# Patient Record
Sex: Female | Born: 1960 | Race: White | Hispanic: No | Marital: Married | Smoking: Never smoker
Health system: Southern US, Community
[De-identification: ages and names within clinical notes are randomized; demographics above are authoritative.]

## PROBLEM LIST (undated history)

## (undated) DIAGNOSIS — H9190 Unspecified hearing loss, unspecified ear: Secondary | ICD-10-CM

---
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
## 2009-11-06 ENCOUNTER — Emergency Department (HOSPITAL_COMMUNITY): Admission: EM | Admit: 2009-11-06 | Discharge: 2009-11-06 | Payer: Self-pay | Admitting: Family Medicine

## 2019-04-19 ENCOUNTER — Encounter (HOSPITAL_COMMUNITY): Payer: Self-pay | Admitting: Emergency Medicine

## 2019-04-19 ENCOUNTER — Other Ambulatory Visit: Payer: Self-pay

## 2019-04-19 ENCOUNTER — Emergency Department (HOSPITAL_COMMUNITY)
Admission: EM | Admit: 2019-04-19 | Discharge: 2019-04-19 | Disposition: A | Discharge status: Home / Self Care | Payer: BC Managed Care – PPO | Attending: Emergency Medicine | Admitting: Emergency Medicine

## 2019-04-19 ENCOUNTER — Emergency Department (HOSPITAL_COMMUNITY): Payer: BC Managed Care – PPO

## 2019-04-19 DIAGNOSIS — W19XXXA Unspecified fall, initial encounter: Secondary | ICD-10-CM

## 2019-04-19 DIAGNOSIS — M25552 Pain in left hip: Secondary | ICD-10-CM | POA: Diagnosis not present

## 2019-04-19 DIAGNOSIS — S299XXA Unspecified injury of thorax, initial encounter: Secondary | ICD-10-CM | POA: Diagnosis not present

## 2019-04-19 DIAGNOSIS — R0781 Pleurodynia: Secondary | ICD-10-CM | POA: Insufficient documentation

## 2019-04-19 HISTORY — DX: Unspecified hearing loss, unspecified ear: H91.90

## 2019-04-19 MED ORDER — LIDOCAINE 5 % EX PTCH: 1.0000 | MEDICATED_PATCH | CUTANEOUS | 0 refills | Status: DC

## 2019-04-19 MED ORDER — DICLOFENAC SODIUM 1 % TD GEL: 4.0000 g | Freq: Four times a day (QID) | TRANSDERMAL | 0 refills | Status: DC

## 2019-04-19 NOTE — Discharge Instructions (Addendum)
Expect your soreness to increase over the next 2-3 days. Take it easy, but do not lay around too much as this may make any stiffness worse.  Antiinflammatory medications: Take 600 mg of ibuprofen every 6 hours or 440 mg (over the counter dose) to 500 mg (prescription dose) of naproxen every 12 hours for the next 3 days. After this time, these medications may be used as needed for pain. Take these medications with food to avoid upset stomach. Choose only one of these medications, do not take them together. Acetaminophen (generic for Tylenol): Should you continue to have additional pain while taking the ibuprofen or naproxen, you may add in acetaminophen as needed. Your daily total maximum amount of acetaminophen from all sources should be limited to 4000mg /day for persons without liver problems, or 2000mg /day for those with liver problems. Diclofenac gel: This is a topical anti-inflammatory medication and can be applied directly to the painful region.  Do not use on the face or genitals.  This medication may be used as an alternative to oral anti-inflammatory medications, such as ibuprofen or naproxen. Lidocaine patches: These are available via either prescription or over-the-counter. The over-the-counter option may be more economical one and are likely just as effective. There are multiple over-the-counter brands, such as Salonpas. Exercises: Be sure to perform the attached exercises starting with three times a week and working up to performing them daily. This is an essential part of preventing long term problems.  Follow up: Follow up with a primary care provider for any future management of these complaints. Be sure to follow up within 7-10 days. Return: Return to the ED should symptoms worsen.  For prescription assistance, may try using prescription discount sites or apps, such as goodrx.com  Your blood pressure was noted to be higher than normal today.  Please have this rechecked through a primary  care provider.

## 2019-04-19 NOTE — ED Triage Notes (Signed)
Patient c/o back pain from sacral area to head. Per patient slide down some wet steps hitting lower back, neck, and head. Denies LOC or dizziness.

## 2019-04-19 NOTE — ED Provider Notes (Signed)
[facility]   CSN: [id number] Arrival date & time: 04/19/19  1111    History   Chief Complaint Chief Complaint  Patient presents with  . Fall    HPI Kristen Fry is a 58 y.o. female.     No language interpreter was used (Patient is comfortable reading lips and this method of communication was utilized.).     Kristen GamblesDebra Fry is a 58 y.o. female, with a history of hard of hearing, presenting to the ED for evaluation following a fall that occurred a few hours prior to arrival. She states she slipped on some wet steps and scraped her left posterior ribs across the steps.  She also states she hit the back of her head. She states her pain is mostly to the left posterior ribs, described as a soreness, mild, nonradiating. Denies anticoagulation. Denies LOC, nausea/vomiting, difficulty breathing, chest pain, abdominal pain, dizziness, numbness, weakness, neck pain, other back pain/midline back pain, or any other complaints.   Past Medical History:  Diagnosis Date  . HOH (hard of hearing)     There are no active problems to display for this patient.   History reviewed. No pertinent surgical history.   OB History   No obstetric history on file.      Home Medications    Prior to Admission medications   Medication Sig Start Date End Date Taking? Authorizing Provider  diclofenac sodium (VOLTAREN) 1 % GEL Apply 4 g topically 4 (four) times daily. 04/19/19   Shalandra Leu C, PA-C  lidocaine (LIDODERM) 5 % Place 1 patch onto the skin daily. Remove & Discard patch within 12 hours or as directed by MD 04/19/19   Anselm PancoastJoy, Abeeha Twist C, PA-C    Family History No family history on file.  Social History Social History   Tobacco Use  . Smoking status: Never Smoker  . Smokeless tobacco: Never Used  Substance Use Topics  . Alcohol use: Never    Frequency: Never    Comment: rarely  . Drug use: Never     Allergies   Patient has no known allergies.   Review  of Systems Review of Systems  Constitutional: Negative for diaphoresis.  Respiratory: Negative for shortness of breath.   Cardiovascular: Negative for chest pain.  Gastrointestinal: Negative for abdominal pain, nausea and vomiting.  Musculoskeletal: Negative for back pain and neck pain.       Left posterior rib pain  Neurological: Negative for dizziness, syncope, weakness, light-headedness, numbness and headaches.  All other systems reviewed and are negative.    Physical Exam Updated Vital Signs BP (!) 180/95 (BP Location: Right Arm)   Pulse 89   Temp 98.1 F (36.7 C) (Oral)   Resp 16   Ht 5\' 3"  (1.6 m)   Wt 67.1 kg   SpO2 99%   BMI 26.22 kg/m   Physical Exam Vitals signs and nursing note reviewed.  Constitutional:      General: She is not in acute distress.    Appearance: She is well-developed. She is not diaphoretic.  HENT:     Head: Normocephalic.     Comments: She has some mild tenderness to the right occipital scalp without swelling, deformity, instability, or noted wounds.    Nose: Nose normal.     Mouth/Throat:     Mouth: Mucous membranes are moist.     Pharynx: Oropharynx is clear.  Eyes:     Extraocular Movements: Extraocular movements intact.     Conjunctiva/sclera: Conjunctivae  normal.     Pupils: Pupils are equal, round, and reactive to light.  Neck:     Musculoskeletal: Full passive range of motion without pain, normal range of motion and neck supple.  Cardiovascular:     Rate and Rhythm: Normal rate and regular rhythm.     Pulses: Normal pulses.          Radial pulses are 2+ on the right side and 2+ on the left side.       Posterior tibial pulses are 2+ on the right side and 2+ on the left side.     Heart sounds: Normal heart sounds.     Comments: Tactile temperature in the extremities appropriate and equal bilaterally. Pulmonary:     Effort: Pulmonary effort is normal. No respiratory distress.     Breath sounds: Normal breath sounds.  Abdominal:      Palpations: Abdomen is soft.     Tenderness: There is no abdominal tenderness. There is no guarding.  Musculoskeletal:       Back:     Right lower leg: No edema.     Left lower leg: No edema.     Comments: Small, scattered, superficial abrasions to left posterior ribs, as shown.  Very mild diffuse tenderness over the left posterior ribs without swelling, crepitus, deformity, or instability.  Full range of motion through the cardinal directions of the cervical spine without pain or noted difficulty.  Normal motor function intact in all extremities. No midline spinal tenderness.   Skin:    General: Skin is warm and dry.  Neurological:     Mental Status: She is alert and oriented to person, place, and time.     Comments: Sensation grossly intact to light touch in the extremities.  Grip strengths equal bilaterally.  Strength 5/5 in all extremities. No gait disturbance. Coordination intact. Cranial nerves III-XII grossly intact. No facial droop.   Psychiatric:        Mood and Affect: Mood and affect normal.        Speech: Speech normal.        Behavior: Behavior normal.      ED Treatments / Results  Labs (all labs ordered are listed, but only abnormal results are displayed) Labs Reviewed - No data to display  EKG None  Radiology Dg Ribs Unilateral W/chest Left  Result Date: 04/19/2019 CLINICAL DATA:  Fall down stairs earlier today, pain LEFT posterior mid to lower ribs. EXAM: LEFT RIBS AND CHEST - 3+ VIEW COMPARISON:  None. FINDINGS: Single-view of the chest: Heart size and mediastinal contours are within normal limits. Lungs are clear. No pleural effusion or pneumothorax. Osseous structures about the chest are unremarkable. No LEFT-sided rib fracture or displacement seen. IMPRESSION: 1. Negative. 2. No displaced rib fracture. Electronically Signed   By: Bary RichardStan  Maynard M.D.   On: 04/19/2019 12:26    Procedures Procedures (including critical care time)  Medications Ordered in ED  Medications - No data to display   Initial Impression / Assessment and Plan / ED Course  I have reviewed the triage vital signs and the nursing notes.  Pertinent labs & imaging results that were available during my care of the patient were reviewed by me and considered in my medical decision making (see chart for details).  Clinical Course as of Apr 18 1250  Wynelle LinkSun Apr 19, 2019  1230 This was discussed with the patient.  She states her blood pressure is "sometimes high."  We spoke about appropriate primary  care follow-up on this matter.  BP(!): 180/95 [SJ]    Clinical Course User Index [SJ] Nikkol Pai C, PA-C       Patient presents for evaluation following a slip and fall that occurred several hours prior to arrival.  She has some soreness to the left posterior ribs and some soreness to the right posterior scalp, however, she has no evidence of neurologic abnormalities.  No evidence of more severe injury.  No acute abnormality on rib x-ray. The patient was given instructions for home care as well as return precautions. Patient voices understanding of these instructions, accepts the plan, and is comfortable with discharge.   Final Clinical Impressions(s) / ED Diagnoses   Final diagnoses:  Fall, initial encounter  Rib pain on left side    ED Discharge Orders         Ordered    diclofenac sodium (VOLTAREN) 1 % GEL  4 times daily     04/19/19 1241    lidocaine (LIDODERM) 5 %  Every 24 hours     04/19/19 [place], Rexine Gowens C, PA-C 04/19/19 1254    Fredia Sorrow, MD 04/19/19 1545

## 2019-04-19 NOTE — ED Notes (Signed)
Fell down stairs this am   Now with pain to L posterior rib area   Hit back of head in fall denies LOC  Pt is very hard of hearing with hearing aid use

## 2019-04-19 NOTE — ED Notes (Signed)
PA in to assess 

## 2019-05-08 ENCOUNTER — Other Ambulatory Visit: Payer: Self-pay

## 2019-05-08 ENCOUNTER — Ambulatory Visit (INDEPENDENT_AMBULATORY_CARE_PROVIDER_SITE_OTHER): Payer: BC Managed Care – PPO | Admitting: Family Medicine

## 2019-05-08 ENCOUNTER — Encounter: Payer: Self-pay | Admitting: Family Medicine

## 2019-05-08 VITALS — BP 134/86 | Temp 97.6°F | Wt 148.4 lb

## 2019-05-08 DIAGNOSIS — H9190 Unspecified hearing loss, unspecified ear: Secondary | ICD-10-CM

## 2019-05-08 DIAGNOSIS — H9193 Unspecified hearing loss, bilateral: Secondary | ICD-10-CM | POA: Diagnosis not present

## 2019-05-08 DIAGNOSIS — R030 Elevated blood-pressure reading, without diagnosis of hypertension: Secondary | ICD-10-CM | POA: Diagnosis not present

## 2019-05-08 DIAGNOSIS — IMO0001 Reserved for inherently not codable concepts without codable children: Secondary | ICD-10-CM

## 2019-05-08 HISTORY — DX: Unspecified hearing loss, unspecified ear: H91.90

## 2019-05-08 HISTORY — DX: Reserved for inherently not codable concepts without codable children: IMO0001

## 2019-05-08 NOTE — Progress Notes (Signed)
   Subjective:    Patient ID: Kristen Fry, female    DOB: 1961/08/02, 58 y.o.   MRN: [id number]  HPI New patient Has no history of high blood pressure diabetes Does have history of hyperlipidemia Overall stays healthy active with her work but does not do any walking on a regular basis Patient does have hearing impairment  Patient arrives for a follow up from a recent ER visit. Patient states she fell and when she was at the ER they told her her BP was elevated and she should have it rechecked.   Review of Systems  Constitutional: Negative for activity change, appetite change and fatigue.  HENT: Negative for congestion and rhinorrhea.   Respiratory: Negative for cough and shortness of breath.   Cardiovascular: Negative for chest pain and leg swelling.  Gastrointestinal: Negative for abdominal pain and diarrhea.  Endocrine: Negative for polydipsia and polyphagia.  Skin: Negative for color change.  Neurological: Negative for dizziness and weakness.  Psychiatric/Behavioral: Negative for behavioral problems and confusion.       Objective:   Physical Exam  Lungs clear respiratory rate normal heart regular no murmurs pulses normal extremities no edema skin warm dry blood pressure checked several different times  We did discuss low-salt diet as well as walking on a regular basis    Assessment & Plan:  Elevated blood pressure on ER visit Currently blood pressure looks good Patient will do the best she can at watching diet staying physically active We will do a wellness to follow-up later this fall Patient will bring back lab work for [place] to review  Hearing impairment able for the patient to understand what was going on by her reading lips elevated blood pressure

## 2019-05-08 NOTE — Patient Instructions (Signed)

## 2019-07-24 ENCOUNTER — Encounter: Payer: BC Managed Care – PPO | Admitting: Nurse Practitioner

## 2019-08-14 ENCOUNTER — Encounter: Payer: BC Managed Care – PPO | Admitting: Family Medicine

## 2019-10-09 ENCOUNTER — Ambulatory Visit (INDEPENDENT_AMBULATORY_CARE_PROVIDER_SITE_OTHER): Payer: BC Managed Care – PPO | Admitting: Nurse Practitioner

## 2019-10-09 ENCOUNTER — Encounter: Payer: Self-pay | Admitting: Nurse Practitioner

## 2019-10-09 VITALS — BP 148/80 | Temp 97.7°F | Ht 62.5 in | Wt 145.8 lb

## 2019-10-09 DIAGNOSIS — Z114 Encounter for screening for human immunodeficiency virus [HIV]: Secondary | ICD-10-CM | POA: Diagnosis not present

## 2019-10-09 DIAGNOSIS — Z1231 Encounter for screening mammogram for malignant neoplasm of breast: Secondary | ICD-10-CM

## 2019-10-09 DIAGNOSIS — Z1159 Encounter for screening for other viral diseases: Secondary | ICD-10-CM

## 2019-10-09 DIAGNOSIS — Z1211 Encounter for screening for malignant neoplasm of colon: Secondary | ICD-10-CM

## 2019-10-09 DIAGNOSIS — Z0000 Encounter for general adult medical examination without abnormal findings: Secondary | ICD-10-CM | POA: Diagnosis not present

## 2019-10-09 DIAGNOSIS — Z13220 Encounter for screening for lipoid disorders: Secondary | ICD-10-CM

## 2019-10-09 DIAGNOSIS — Z124 Encounter for screening for malignant neoplasm of cervix: Secondary | ICD-10-CM | POA: Diagnosis not present

## 2019-10-09 DIAGNOSIS — Z1151 Encounter for screening for human papillomavirus (HPV): Secondary | ICD-10-CM | POA: Diagnosis not present

## 2019-10-09 DIAGNOSIS — Z01419 Encounter for gynecological examination (general) (routine) without abnormal findings: Secondary | ICD-10-CM

## 2019-10-09 DIAGNOSIS — E559 Vitamin D deficiency, unspecified: Secondary | ICD-10-CM | POA: Diagnosis not present

## 2019-10-09 NOTE — Progress Notes (Signed)
Subjective:    Patient ID: Kristen Fry, female    DOB: [birth year], 59 y.o.   MRN: [id number]  HPI The patient comes in today for a wellness visit.    A review of their health history was completed.  A review of medications was also completed.  Any needed refills; not on any meds  Eating habits: health conscious  Falls/  MVA accidents in past few months: none  Regular exercise: walking  Specialist pt sees on regular basis: none  Preventative health issues were discussed.   Additional concerns: none   Review of Systems  Constitutional: Negative for activity change, appetite change, fatigue and fever.  HENT: Positive for hearing loss.        Wears hearing aids in both ears.  Respiratory: Negative for cough, chest tightness, shortness of breath and wheezing.   Cardiovascular: Negative for chest pain.  Gastrointestinal: Negative for abdominal pain, blood in stool, constipation, diarrhea, nausea and vomiting.  Genitourinary: Negative for difficulty urinating, dysuria, enuresis, frequency, genital sores, menstrual problem, pelvic pain and urgency.  Skin: Negative for rash.    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 No family history of colon, breast, or female cancers. Patient has never had a colonoscopy and declines one today. Patient is married and has had same sexual partner. No complaints of vaginal bleeding or pelvic pain.   Depression screen PHQ 2/9 10/09/2019  Decreased Interest 0  Down, Depressed, Hopeless 0  PHQ - 2 Score 0        Objective:   Physical Exam Constitutional:      General: She is not in acute distress.    Appearance: Normal appearance.  Neck:     Comments: Thyroid non tender to palpation. No mass or goiter noted.  Cardiovascular:     Rate and Rhythm: Normal rate and regular rhythm.      Pulses: Normal pulses.     Heart sounds: Normal heart sounds. No murmur.  Pulmonary:     Effort: Pulmonary effort is normal.     Breath sounds: Normal breath sounds.  Chest:     Breasts:        Right: Normal. No swelling, inverted nipple, mass, skin change or tenderness.        Left: Normal. No swelling, inverted nipple, mass, skin change or tenderness.  Abdominal:     General: There is no distension.     Palpations: Abdomen is soft. There is no mass.     Tenderness: There is no abdominal tenderness.  Genitourinary:    General: Normal vulva.     Vagina: No vaginal discharge.     Comments: External GU: no rashes or lesions. Vagina: pink, no discharge. Cervix: normal in appearance; no CMT; Bimanual: no tenderness or obvious masses.  Musculoskeletal:     Cervical back: Normal range of motion.  Lymphadenopathy:     Cervical: No cervical adenopathy.     Upper Body:     Right upper body: No supraclavicular, axillary or pectoral adenopathy.     Left upper body: No supraclavicular, axillary or pectoral adenopathy.  Skin:    General: Skin is warm and dry.  Neurological:     Mental Status: She is alert and oriented to person, place, and time.  Psychiatric:        Mood and Affect:  Mood normal.        Behavior: Behavior normal.        Thought Content: Thought content normal.        Judgment: Judgment normal.         Assessment & Plan:   Problem List Items Addressed This Visit    None    Visit Diagnoses    Well woman exam    -  Primary   Relevant Orders   Lipid panel   Hepatic function panel   CBC with Differential/Platelet   VITAMIN D 25 Hydroxy (Vit-D Deficiency, Fractures)   Basic metabolic panel   Hepatitis C antibody   HIV Antibody (routine testing w rflx)   IFOBT POC (occult bld, rslt in office)   IGP, Aptima HPV   Encounter for screening mammogram for breast cancer       Relevant Orders   MM 3D SCREEN BREAST BILATERAL   Screen for colon cancer       Relevant  Orders   IFOBT POC (occult bld, rslt in office)   Screening for HIV (human immunodeficiency virus)       Relevant Orders   HIV Antibody (routine testing w rflx)   Encounter for hepatitis C screening test for low risk patient       Screening for lipid disorders       Relevant Orders   Lipid panel   Screening for cervical cancer       Relevant Orders   IGP, Aptima HPV   Screening for HPV (human papillomavirus)       Relevant Orders   IGP, Aptima HPV     Labs pending. Defers colonoscopy but agrees to iFOBT. Recommend tetanus update at local pharmacy. Return in about 1 year (around 10/08/2020) for physical.   I have seen and examined this patient alongside the NP student and agree with the  assessment and plan.   Pearson Forster, FNP

## 2019-10-10 LAB — HIV ANTIBODY (ROUTINE TESTING W REFLEX): HIV Screen 4th Generation wRfx: NONREACTIVE

## 2019-10-10 LAB — BASIC METABOLIC PANEL
BUN/Creatinine Ratio: 21 (ref 9–23)
BUN: 14 mg/dL (ref 6–24)
CO2: 21 mmol/L (ref 20–29)
Calcium: 9.8 mg/dL (ref 8.7–10.2)
Chloride: 102 mmol/L (ref 96–106)
Creatinine, Ser: 0.68 mg/dL (ref 0.57–1.00)
GFR calc Af Amer: 113 mL/min/{1.73_m2} (ref >59)
GFR calc non Af Amer: 98 mL/min/{1.73_m2} (ref >59)
Glucose: 99 mg/dL (ref 65–99)
Potassium: 4.4 mmol/L (ref 3.5–5.2)
Sodium: 141 mmol/L (ref 134–144)

## 2019-10-10 LAB — LIPID PANEL
Chol/HDL Ratio: 1.9 ratio (ref 0.0–4.4)
Cholesterol, Total: 259 mg/dL — ABNORMAL HIGH (ref 100–199)
HDL: 139 mg/dL (ref >39)
LDL Chol Calc (NIH): 110 mg/dL — ABNORMAL HIGH (ref 0–99)
Triglycerides: 62 mg/dL (ref 0–149)
VLDL Cholesterol Cal: 10 mg/dL (ref 5–40)

## 2019-10-10 LAB — CBC WITH DIFFERENTIAL/PLATELET
Basophils Absolute: 0.1 10*3/uL (ref 0.0–0.2)
Basos: 1 %
EOS (ABSOLUTE): 0 10*3/uL (ref 0.0–0.4)
Eos: 1 %
Hematocrit: 42.6 % (ref 34.0–46.6)
Hemoglobin: 14 g/dL (ref 11.1–15.9)
Immature Grans (Abs): 0 10*3/uL (ref 0.0–0.1)
Immature Granulocytes: 0 %
Lymphocytes Absolute: 1.5 10*3/uL (ref 0.7–3.1)
Lymphs: 24 %
MCH: 30.2 pg (ref 26.6–33.0)
MCHC: 32.9 g/dL (ref 31.5–35.7)
MCV: 92 fL (ref 79–97)
Monocytes Absolute: 0.5 10*3/uL (ref 0.1–0.9)
Monocytes: 8 %
Neutrophils Absolute: 4 10*3/uL (ref 1.4–7.0)
Neutrophils: 66 %
Platelets: 462 10*3/uL — ABNORMAL HIGH (ref 150–450)
RBC: 4.63 x10E6/uL (ref 3.77–5.28)
RDW: 12.7 % (ref 11.7–15.4)
WBC: 6.1 10*3/uL (ref 3.4–10.8)

## 2019-10-10 LAB — HEPATIC FUNCTION PANEL
ALT: 20 IU/L (ref 0–32)
AST: 21 IU/L (ref 0–40)
Albumin: 4.6 g/dL (ref 3.8–4.9)
Alkaline Phosphatase: 68 IU/L (ref 39–117)
Bilirubin Total: 0.4 mg/dL (ref 0.0–1.2)
Bilirubin, Direct: 0.13 mg/dL (ref 0.00–0.40)
Total Protein: 7.3 g/dL (ref 6.0–8.5)

## 2019-10-10 LAB — VITAMIN D 25 HYDROXY (VIT D DEFICIENCY, FRACTURES): Vit D, 25-Hydroxy: 28.6 ng/mL — ABNORMAL LOW (ref 30.0–100.0)

## 2019-10-10 LAB — HEPATITIS C ANTIBODY: Hep C Virus Ab: <0.1 s/co ratio (ref 0.0–0.9)

## 2019-10-12 ENCOUNTER — Other Ambulatory Visit: Payer: Self-pay

## 2019-10-12 DIAGNOSIS — Z1211 Encounter for screening for malignant neoplasm of colon: Secondary | ICD-10-CM

## 2019-10-12 DIAGNOSIS — Z01419 Encounter for gynecological examination (general) (routine) without abnormal findings: Secondary | ICD-10-CM

## 2019-10-12 LAB — IFOBT (OCCULT BLOOD): IFOBT: NEGATIVE

## 2019-10-15 LAB — IGP, APTIMA HPV: HPV Aptima: NEGATIVE

## 2019-10-16 ENCOUNTER — Ambulatory Visit (HOSPITAL_COMMUNITY): Payer: PRIVATE HEALTH INSURANCE

## 2019-10-23 ENCOUNTER — Other Ambulatory Visit: Payer: Self-pay

## 2019-10-23 ENCOUNTER — Ambulatory Visit (HOSPITAL_COMMUNITY)
Admission: RE | Admit: 2019-10-23 | Discharge: 2019-10-23 | Disposition: A | Discharge status: Home / Self Care | Payer: BC Managed Care – PPO | Source: Ambulatory Visit | Attending: Nurse Practitioner | Admitting: Nurse Practitioner

## 2019-10-23 DIAGNOSIS — Z1231 Encounter for screening mammogram for malignant neoplasm of breast: Secondary | ICD-10-CM | POA: Insufficient documentation

## 2019-12-07 ENCOUNTER — Telehealth: Payer: Self-pay | Admitting: Family Medicine

## 2019-12-07 NOTE — Telephone Encounter (Signed)
Physical form placed in nurses box. Return to [facility] when done. (For [place])

## 2019-12-07 NOTE — Telephone Encounter (Signed)
Form on carolyn's desk 

## 2019-12-14 NOTE — Telephone Encounter (Signed)
[name], I reviewed Kristen Fry's PE form. I did not see a space for required signature. Will get this back to you.  Thanks, Eber Jones

## 2020-09-03 IMAGING — DX LEFT RIBS AND CHEST - 3+ VIEW
4 series · 4 of 4 positions shown · non-contrast
Comparison: None.

CLINICAL DATA: Fall down stairs earlier today, pain LEFT posterior
mid to lower ribs.

EXAM:
LEFT RIBS AND CHEST - 3+ VIEW

[chest pa]
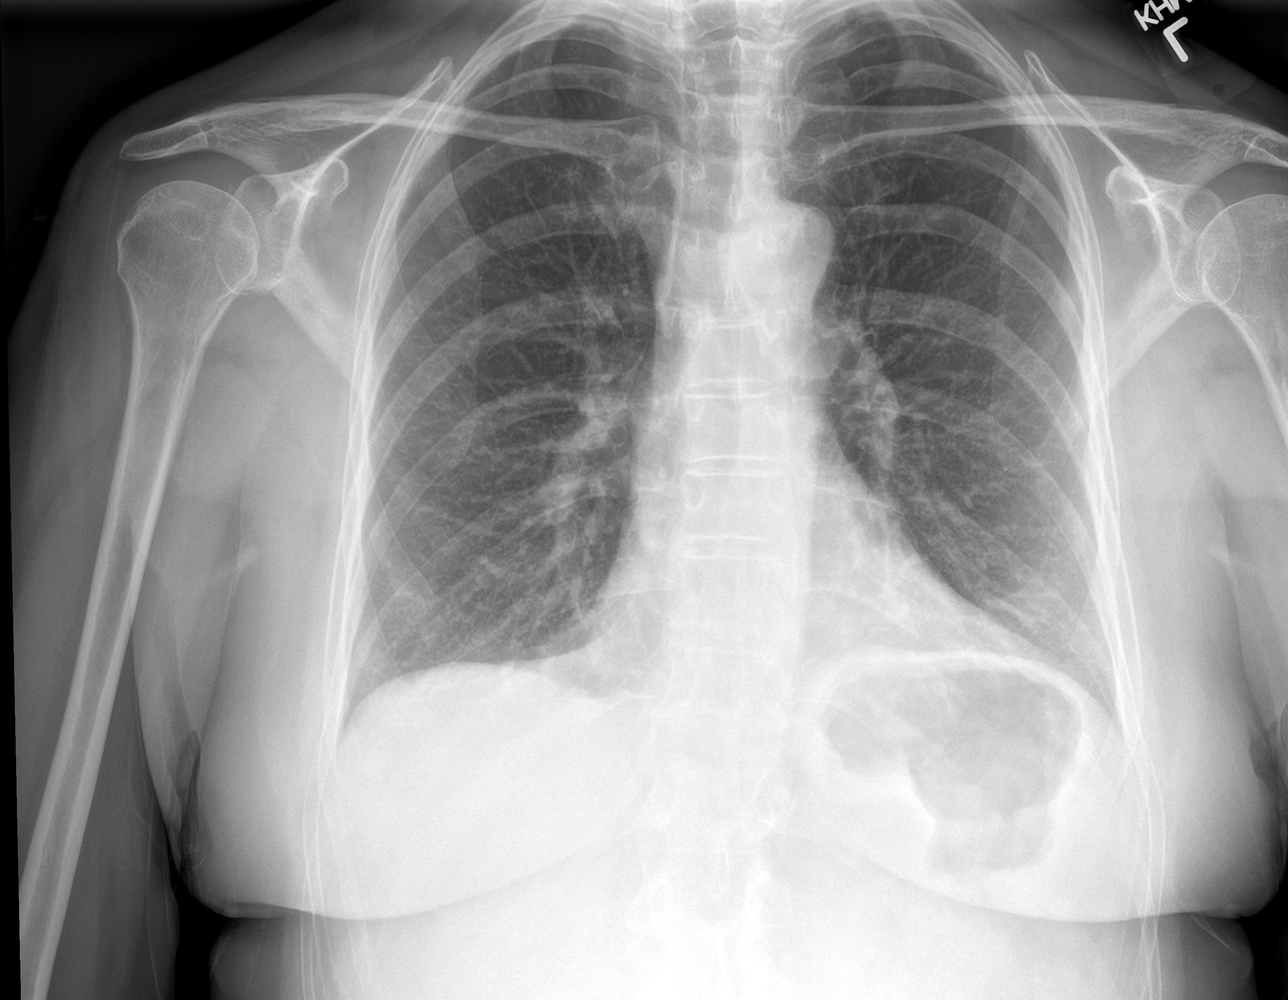

[rib pa obl]
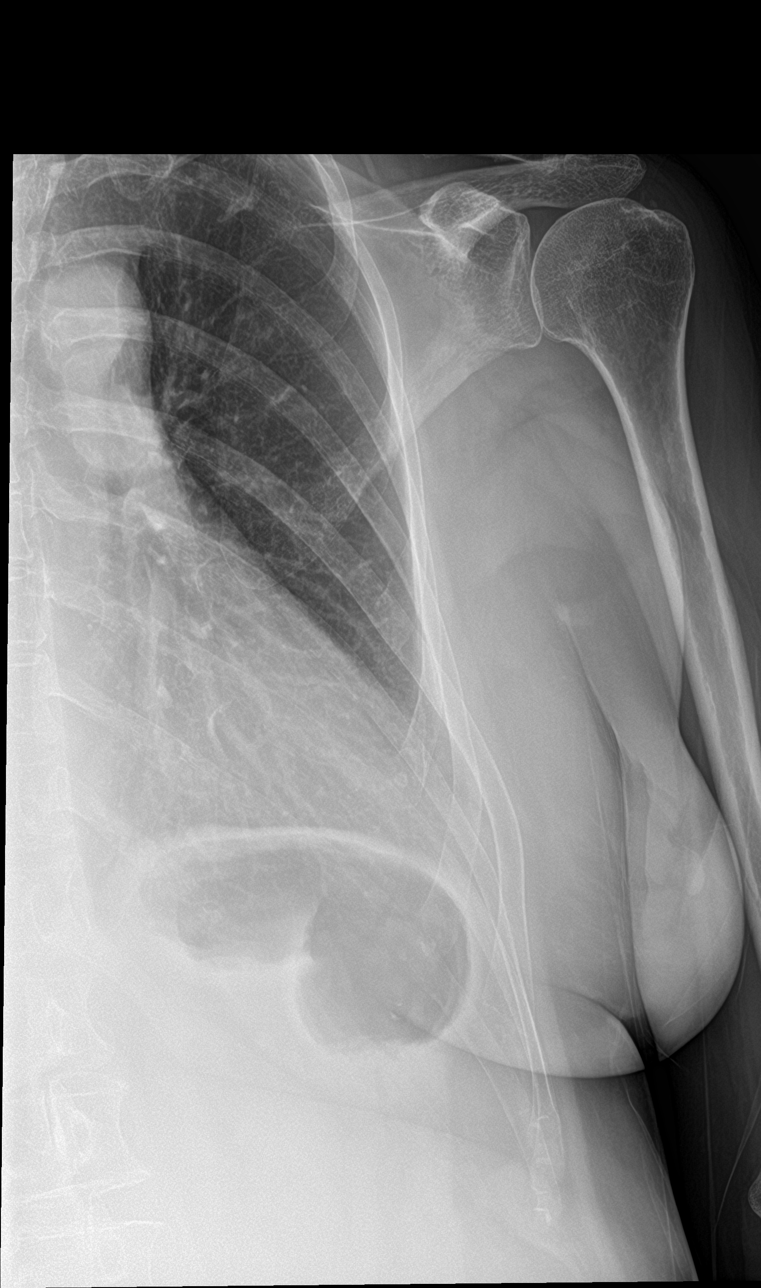

[rib pa (1 of 2)]
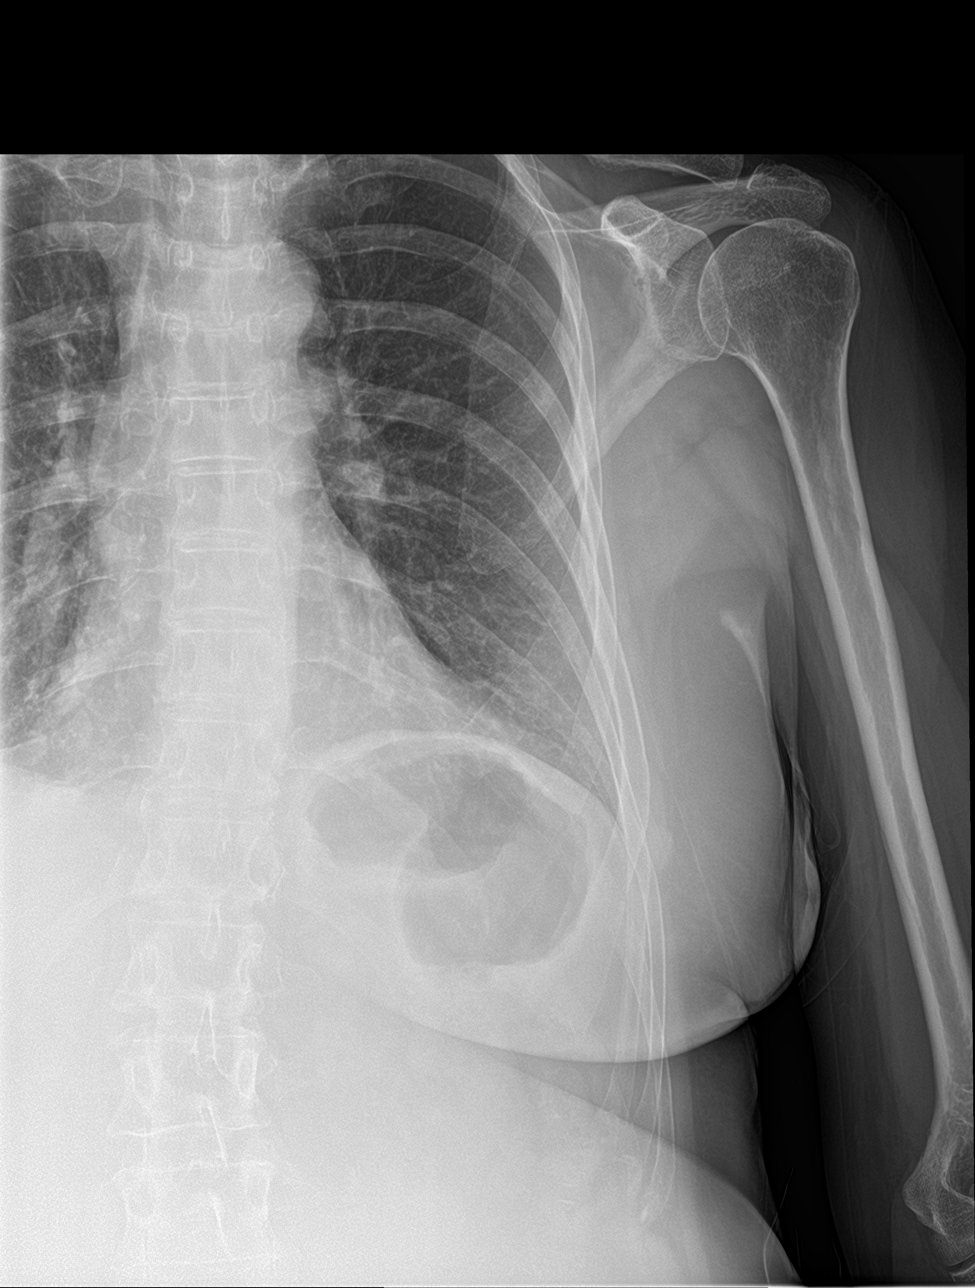

[rib pa (2 of 2)]
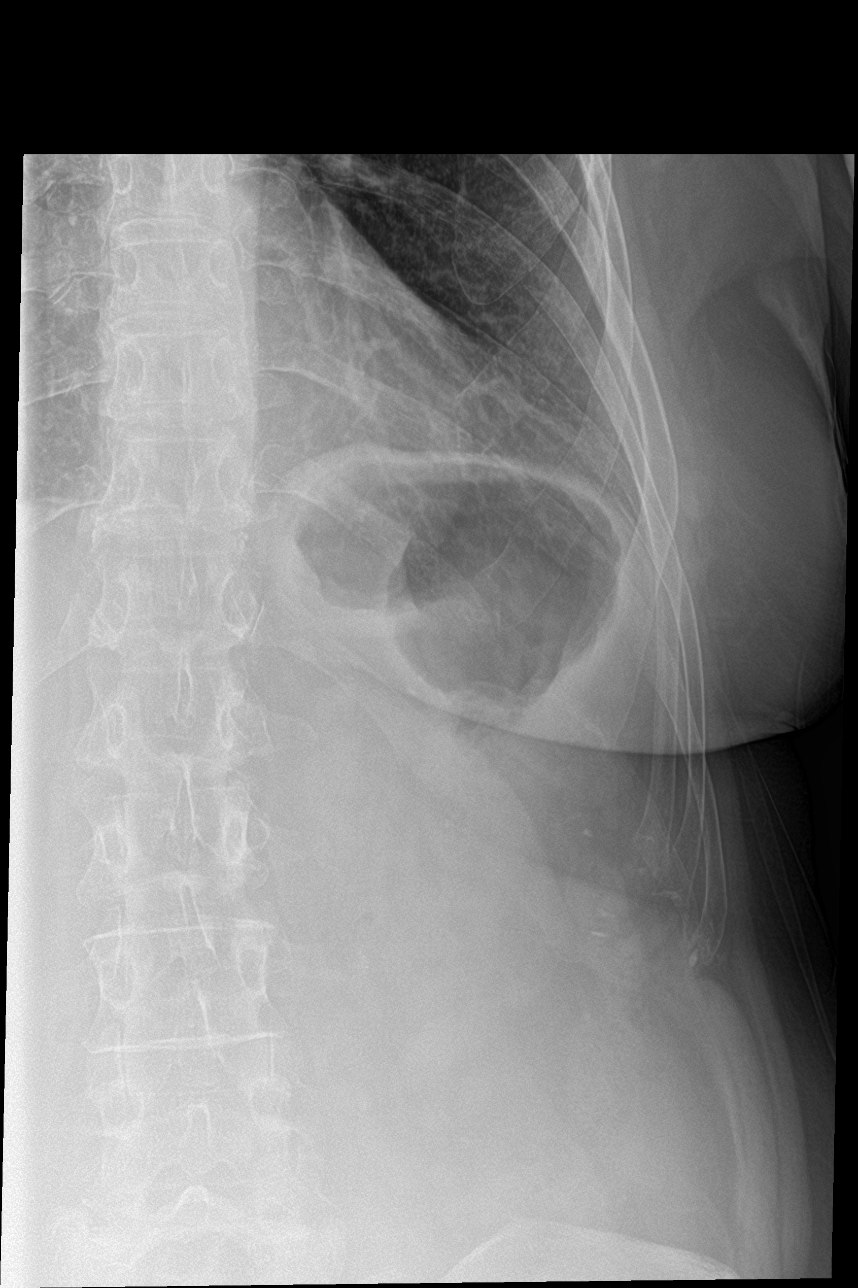

[4 of 4 positions shown; findings below may reference images not displayed]

FINDINGS: Single-view of the chest: Heart size and mediastinal contours are
within normal limits. Lungs are clear. No pleural effusion or
pneumothorax.

Osseous structures about the chest are unremarkable. No LEFT-sided
rib fracture or displacement seen.
IMPRESSION: 1. Negative.
2. No displaced rib fracture.

## 2021-03-09 IMAGING — MG DIGITAL SCREENING BILAT W/ TOMO W/ CAD
6 of 10 series · 6 of 30 positions shown · non-contrast
Comparison: None.

CLINICAL DATA: Screening.

EXAM:
DIGITAL SCREENING BILATERAL MAMMOGRAM WITH TOMO AND CAD

[R MLO synth-2D]
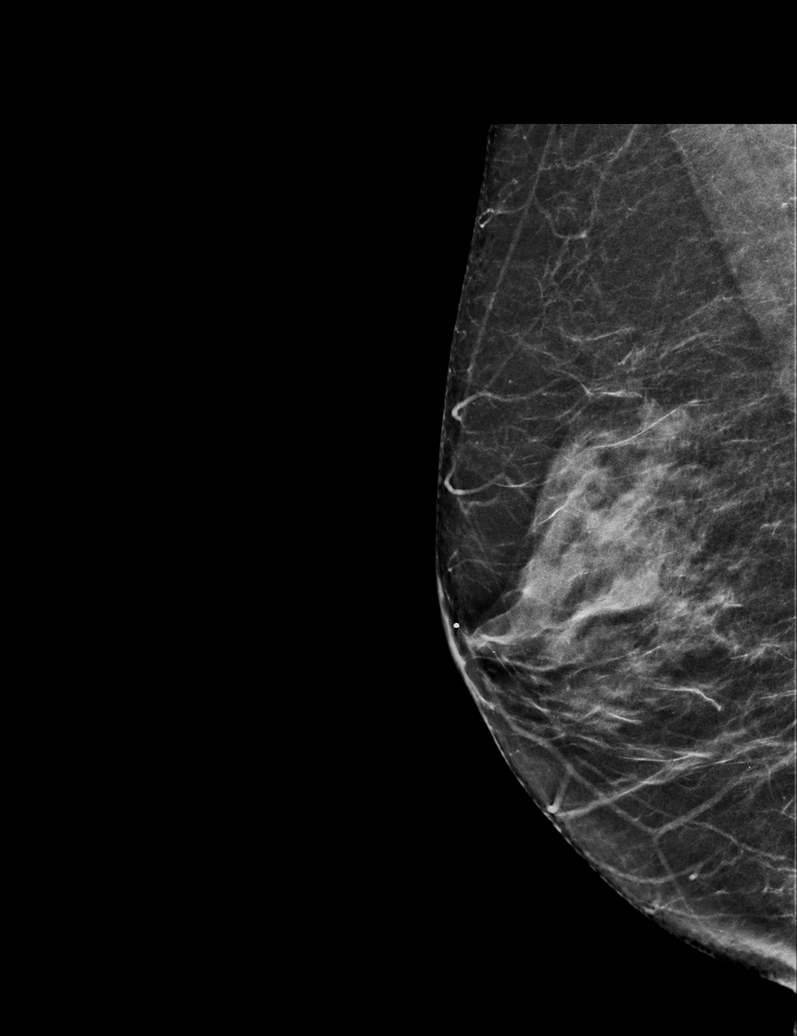

[L MLO synth-2D (1 of 2)]
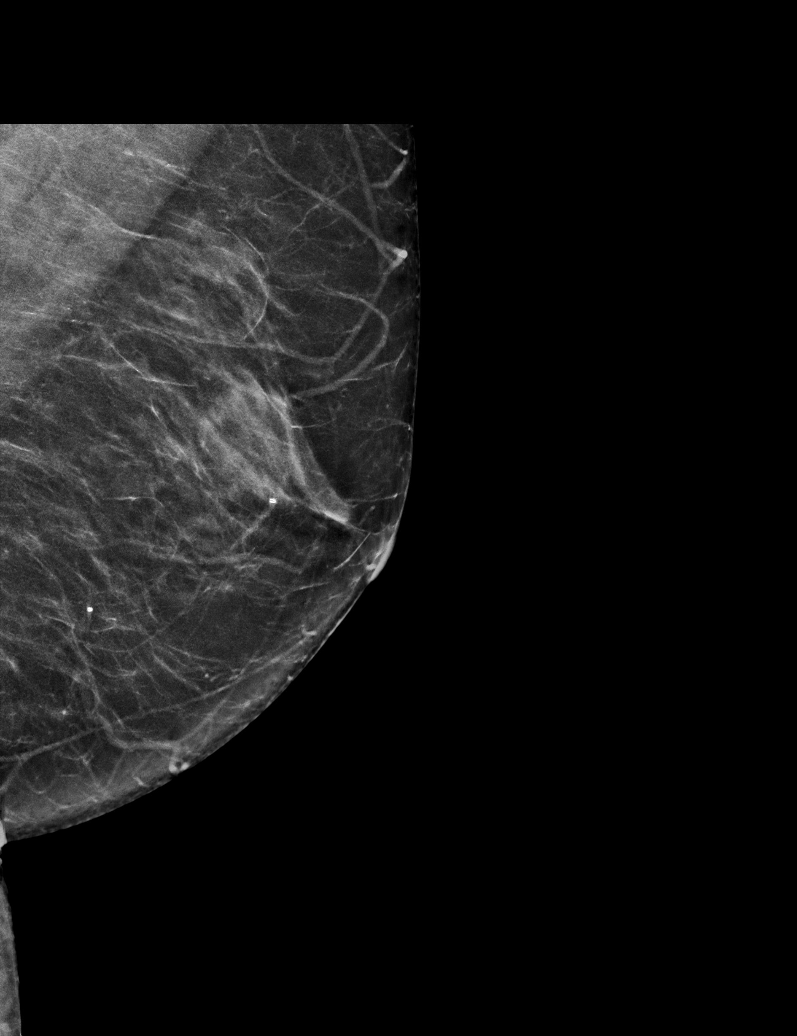

[L MLO synth-2D (2 of 2)]
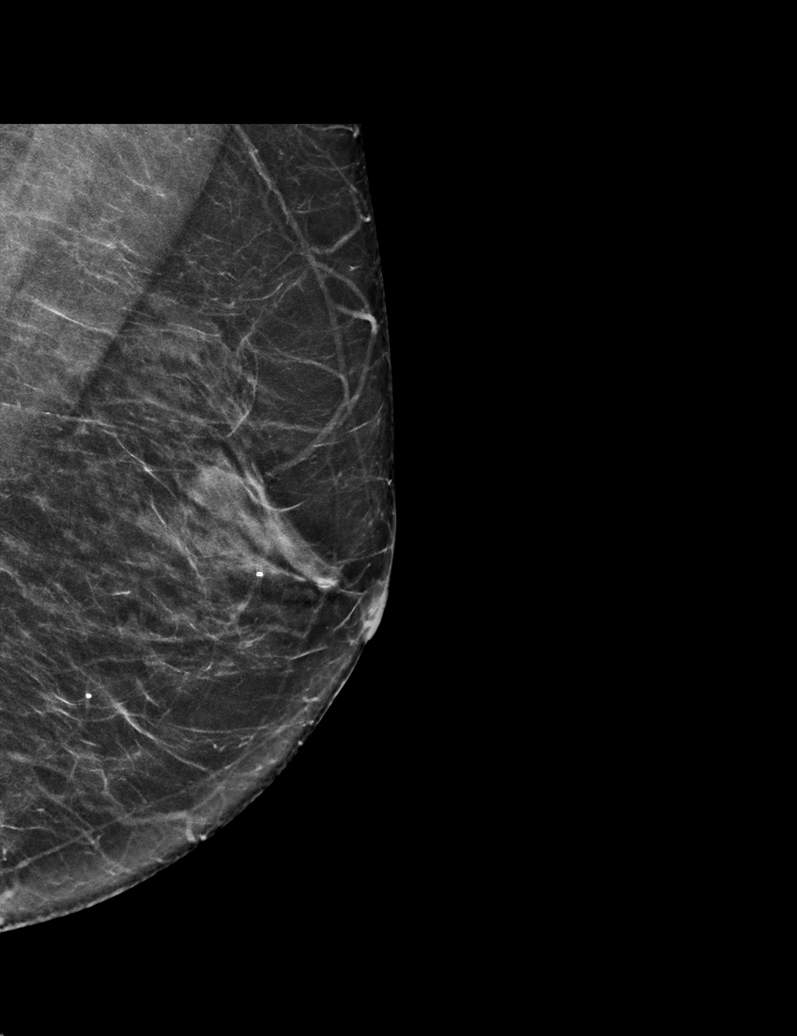

[R CC synth-2D]
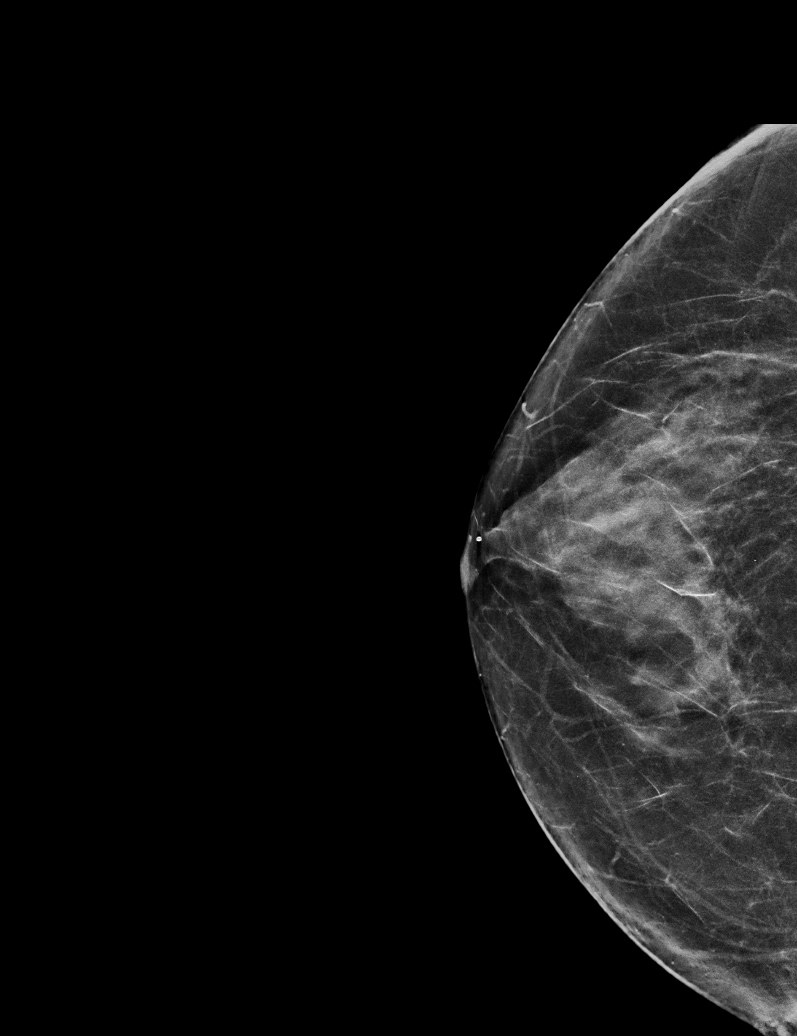

[L CC synth-2D]
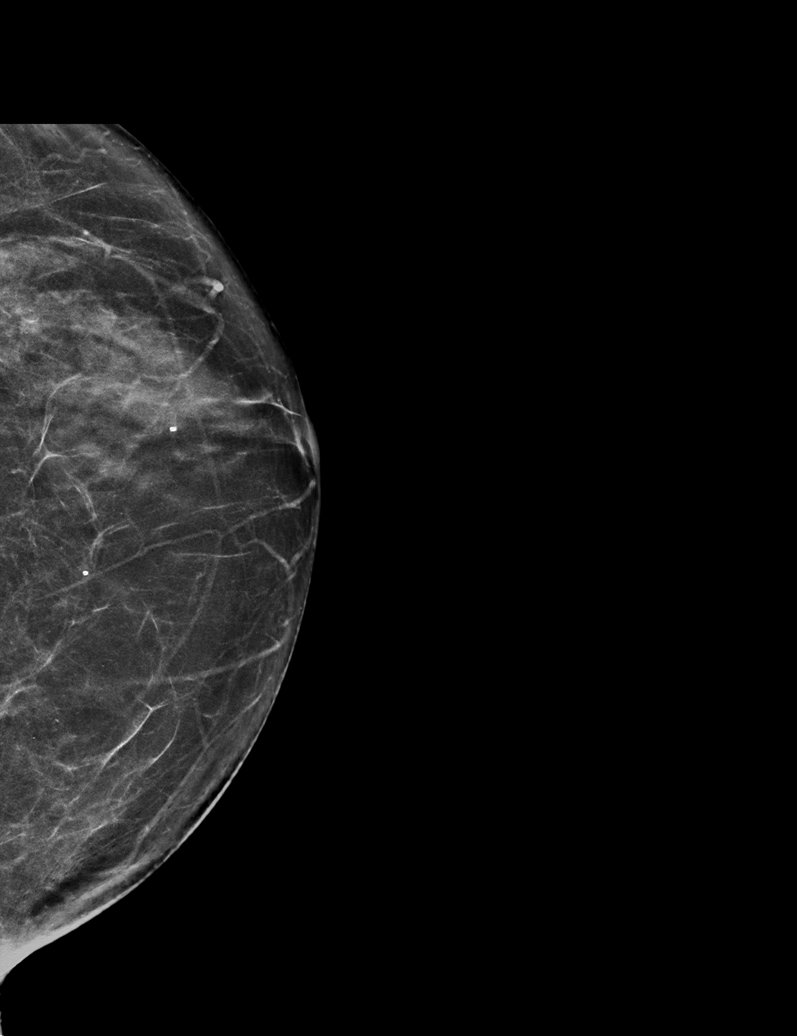

[R MLO tomo · tomo slice 31/60.0]
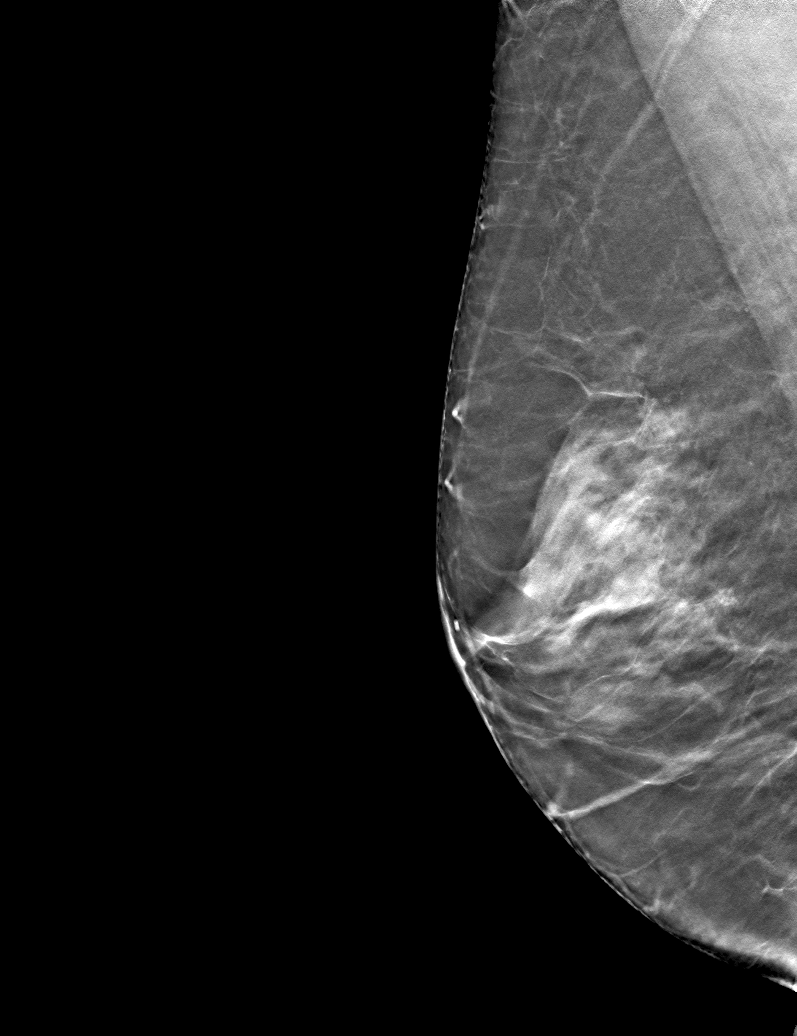

[6 of 30 positions shown; findings below may reference images not displayed]

ACR Breast Density Category c: The breast tissue is heterogeneously
dense, which may obscure small masses
FINDINGS: There are no findings suspicious for malignancy. Images were
processed with CAD.
IMPRESSION: No mammographic evidence of malignancy. A result letter of this
screening mammogram will be mailed directly to the patient.

RECOMMENDATION:
Screening mammogram in one year. (Code:EM-2-IHY)

BI-RADS CATEGORY  1: Negative.
